# Patient Record
Sex: Male | Born: 1995 | Race: White | Hispanic: No | Marital: Single | Smoking: Never smoker
Health system: Southern US, Community
[De-identification: ages and names within clinical notes are randomized; demographics above are authoritative.]

## PROBLEM LIST (undated history)

## (undated) DIAGNOSIS — I10 Essential (primary) hypertension: Secondary | ICD-10-CM

## (undated) HISTORY — PX: TONSILLECTOMY: SUR1361

## (undated) HISTORY — PX: WISDOM TOOTH EXTRACTION: SHX21

---
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
## 2015-03-24 ENCOUNTER — Encounter: Payer: Self-pay | Admitting: *Deleted

## 2015-03-24 ENCOUNTER — Emergency Department
Admission: EM | Admit: 2015-03-24 | Discharge: 2015-03-24 | Disposition: A | Discharge status: Home / Self Care | Payer: Medicaid Other | Attending: Emergency Medicine | Admitting: Emergency Medicine

## 2015-03-24 DIAGNOSIS — R21 Rash and other nonspecific skin eruption: Secondary | ICD-10-CM | POA: Diagnosis present

## 2015-03-24 DIAGNOSIS — L03115 Cellulitis of right lower limb: Secondary | ICD-10-CM | POA: Diagnosis not present

## 2015-03-24 MED ORDER — SULFAMETHOXAZOLE-TRIMETHOPRIM 800-160 MG PO TABS: 1.0000 | ORAL_TABLET | Freq: Two times a day (BID) | ORAL | Status: AC

## 2015-03-24 MED ORDER — SULFAMETHOXAZOLE-TRIMETHOPRIM 800-160 MG PO TABS
2.0000 | ORAL_TABLET | Freq: Once | ORAL | Status: AC
  Administered 2015-03-24: 2 via ORAL
  Filled 2015-03-24: qty 2

## 2015-03-24 NOTE — ED Notes (Signed)
Pt. Going home with mother. 

## 2015-03-24 NOTE — ED Notes (Signed)
Pt. Walked into room with no distress.  Pt. Has a rounded red area on lower outside of rt. Leg about 3 inches wide.  Pt. States he noticed it yesterday.

## 2015-03-24 NOTE — ED Notes (Signed)
Pt has abscess to right lower leg.  No drainage.  No itching.

## 2015-03-24 NOTE — ED Provider Notes (Signed)
[facility] Emergency Department Provider Note  ____________________________________________  Time seen: 2:15 AM  I have reviewed the triage vital signs and the nursing notes.   HISTORY  Chief Complaint Abscess     HPI Geoffrey Rodriguez is a 19 y.o. male presents with possible abscess to right lower leg that he noted 2 days ago. Patient describes the area as redness and discomfort. Patient denies any insect bite including ticks that he is aware of. Patient denies any fever no joint pain. Patient denies any previous abscess.    Past medical history None There are no active problems to display for this patient.   Past surgical history None No current outpatient prescriptions on file.  Allergies No known drug allergies No family history on file.  Social History Social History  Substance Use Topics  . Smoking status: Never Smoker   . Smokeless tobacco: Not on file  . Alcohol Use: No    Review of Systems  Constitutional: Negative for fever. Eyes: Negative for visual changes. ENT: Negative for sore throat. Cardiovascular: Negative for chest pain. Respiratory: Negative for shortness of breath. Gastrointestinal: Negative for abdominal pain, vomiting and diarrhea. Genitourinary: Negative for dysuria. Musculoskeletal: Negative for back pain. Skin: Positive for rash. Neurological: Negative for headaches, focal weakness or numbness.   10-point ROS otherwise negative.  ____________________________________________   PHYSICAL EXAM:  VITAL SIGNS: ED Triage Vitals  Enc Vitals Group     BP 03/24/15 0047 155/83 mmHg     Pulse Rate 03/24/15 0047 99     Resp 03/24/15 0047 18     Temp 03/24/15 0047 98.4 F (36.9 C)     Temp Source 03/24/15 0047 Oral     SpO2 03/24/15 0047 100 %     Weight 03/24/15 0047 290 lb (131.543 kg)     Height 03/24/15 0047 6\' 4"  (1.93 m)     Head Cir --      Peak Flow --      Pain Score --      Pain Loc --    Pain Edu? --      Excl. in GC? --      Constitutional: Alert and oriented. Well appearing and in no distress. Musculoskeletal: Nontender with normal range of motion in all extremities. No joint effusions.  No lower extremity tenderness nor edema. Neurologic:  Normal speech and language.  Speech is normal.  Skin:  Positive blanching erythema area of approximately 5 x 5 cm on the right lower leg Psychiatric: Mood and affect are normal. Speech and behavior are normal. Patient exhibits appropriate insight and judgment.       INITIAL IMPRESSION / ASSESSMENT AND PLAN / ED COURSE  Pertinent labs & imaging results that were available during my care of the patient were reviewed by me and considered in my medical decision making (see chart for details).  Patient received Bactrim DS in the emergency department and we prescribed the same at home for cellulitis without any evidence of an abscess at this time. I counseled the patient to return to the emergency department immediately if the area of redness or discomfort were to occur. Consider the possibility of Lyme disease however area of erythema does not appear to be erythema migrans at this time  ____________________________________________   FINAL CLINICAL IMPRESSION(S) / ED DIAGNOSES  Final diagnoses:  Cellulitis of right leg      Darci Currentandolph N Brown, MD 03/24/15 [phone]

## 2015-03-24 NOTE — Discharge Instructions (Signed)

## 2016-04-20 ENCOUNTER — Emergency Department
Admission: EM | Admit: 2016-04-20 | Discharge: 2016-04-20 | Disposition: A | Discharge status: Home / Self Care | Payer: Medicaid Other | Attending: Emergency Medicine | Admitting: Emergency Medicine

## 2016-04-20 ENCOUNTER — Encounter: Payer: Self-pay | Admitting: *Deleted

## 2016-04-20 DIAGNOSIS — Z23 Encounter for immunization: Secondary | ICD-10-CM | POA: Insufficient documentation

## 2016-04-20 DIAGNOSIS — Y990 Civilian activity done for income or pay: Secondary | ICD-10-CM | POA: Insufficient documentation

## 2016-04-20 DIAGNOSIS — S0181XA Laceration without foreign body of other part of head, initial encounter: Secondary | ICD-10-CM | POA: Insufficient documentation

## 2016-04-20 DIAGNOSIS — S0990XA Unspecified injury of head, initial encounter: Secondary | ICD-10-CM

## 2016-04-20 DIAGNOSIS — W208XXA Other cause of strike by thrown, projected or falling object, initial encounter: Secondary | ICD-10-CM | POA: Diagnosis not present

## 2016-04-20 DIAGNOSIS — Y9389 Activity, other specified: Secondary | ICD-10-CM | POA: Insufficient documentation

## 2016-04-20 DIAGNOSIS — Y929 Unspecified place or not applicable: Secondary | ICD-10-CM | POA: Diagnosis not present

## 2016-04-20 MED ORDER — TETANUS-DIPHTH-ACELL PERTUSSIS 5-2.5-18.5 LF-MCG/0.5 IM SUSP
0.5000 mL | Freq: Once | INTRAMUSCULAR | Status: AC
  Administered 2016-04-20: 0.5 mL via INTRAMUSCULAR
  Filled 2016-04-20: qty 0.5

## 2016-04-20 NOTE — ED Provider Notes (Signed)
[facility] Emergency Department Provider Note  ____________________________________________  Time seen: Approximately 10:55 PM  I have reviewed the triage vital signs and the nursing notes.   HISTORY  Chief Complaint Head Injury and Abrasion    HPI Geoffrey Rodriguez is a 20 y.o. male who presents emergency department complaining of minor head injury. Patient states that he was at work when a piece of sheet metal fell and caught him in the forehead. He reports a laceration to the area that was bleeding. He was able to control bleeding easily with direct pressure. Patient denies any loss consciousness. He denies any headache, vision changes, neck pain, chest pain, shortness of breath, abdominal pain, nausea or vomiting. Patient is unsure of his last tetanus shot. No other complaints or injuries. No medications prior to arrival.   No past medical history on file.  There are no active problems to display for this patient.   No past surgical history on file.  Prior to Admission medications   Not on File    Allergies Patient has no known allergies.  No family history on file.  Social History Social History  Substance Use Topics  . Smoking status: Never Smoker  . Smokeless tobacco: Never Used  . Alcohol use No     Review of Systems  Constitutional: No fever/chills Eyes: No visual changes Cardiovascular: no chest pain. Respiratory: no cough. No SOB. Gastrointestinal: No abdominal pain.  No nausea, no vomiting. Musculoskeletal: Negative for musculoskeletal pain. Skin: Positive for laceration to the forehead. Neurological: Negative for headaches, focal weakness or numbness. 10-point ROS otherwise negative.  ____________________________________________   PHYSICAL EXAM:  VITAL SIGNS: ED Triage Vitals  Enc Vitals Group     BP 04/20/16 2226 (!) 141/67     Pulse Rate 04/20/16 2226 90     Resp 04/20/16 2226 20     Temp 04/20/16 2226 98 F (36.7  C)     Temp Source 04/20/16 2226 Oral     SpO2 04/20/16 2226 99 %     Weight 04/20/16 2229 270 lb (122.5 kg)     Height 04/20/16 2229 6\' 4"  (1.93 m)     Head Circumference --      Peak Flow --      Pain Score 04/20/16 2229 0     Pain Loc --      Pain Edu? --      Excl. in GC? --      Constitutional: Alert and oriented. Well appearing and in no acute distress. Eyes: Conjunctivae are normal. PERRL. EOMI. Head: Small, 0.5 cm laceration noted to the left side of the forehead. No bleeding. No foreign body. Edges are well approximated. No surrounding ecchymosis or edema. Patient is nontender to palpation over the osseous structures of the skull and face. No battle signs. No raccoon eyes. No serosanguineous fluid drainage from the ears or nares.. ENT:      Ears:       Nose: No congestion/rhinnorhea.      Mouth/Throat: Mucous membranes are moist.  Neck: No stridor.  No cervical spine tenderness to palpation.  Cardiovascular: Normal rate, regular rhythm. Normal S1 and S2.  Good peripheral circulation. Respiratory: Normal respiratory effort without tachypnea or retractions. Lungs CTAB. Good air entry to the bases with no decreased or absent breath sounds. Musculoskeletal: Full range of motion to all extremities. No gross deformities appreciated. Neurologic:  Normal speech and language. No gross focal neurologic deficits are appreciated.  Skin:  Skin  is warm, dry and intact. No rash noted. See above note for small laceration to the forehead. Psychiatric: Mood and affect are normal. Speech and behavior are normal. Patient exhibits appropriate insight and judgement.   ____________________________________________   LABS (all labs ordered are listed, but only abnormal results are displayed)  Labs Reviewed - No data to display ____________________________________________  EKG   ____________________________________________  RADIOLOGY   No results  found.  ____________________________________________    PROCEDURES  Procedure(s) performed:    Marland Kitchen.Marland Kitchen.Laceration Repair Date/Time: 04/20/2016 10:55 PM Performed by: Gala RomneyUTHRIELL, JONATHAN D Authorized by: Gala RomneyUTHRIELL, JONATHAN D   Consent:    Consent obtained:  Verbal   Consent given by:  Patient   Alternatives discussed:  No treatment Anesthesia (see MAR for exact dosages):    Anesthesia method:  None Laceration details:    Location:  Face   Face location:  Forehead   Length (cm):  0.5 Repair type:    Repair type:  Simple Exploration:    Wound exploration: entire depth of wound probed and visualized     Wound extent: no foreign bodies/material noted     Contaminated: no   Treatment:    Area cleansed with:  Shur-Clens   Amount of cleaning:  Standard   Irrigation solution:  Sterile saline   Irrigation method:  Syringe Skin repair:    Repair method:  Tissue adhesive Approximation:    Approximation:  Close Post-procedure details:    Dressing:  Open (no dressing)   Patient tolerance of procedure:  Tolerated well, no immediate complications      Medications  Tdap (BOOSTRIX) injection 0.5 mL (0.5 mLs Intramuscular Given 04/20/16 2300)     ____________________________________________   INITIAL IMPRESSION / ASSESSMENT AND PLAN / ED COURSE  Pertinent labs & imaging results that were available during my care of the patient were reviewed by me and considered in my medical decision making (see chart for details).  Review of the [place] CSRS was performed in accordance of the [facility] prior to dispensing any controlled drugs.  Clinical Course     Patient's diagnosis is consistent with Minor head injury resulting in left-sided forehead laceration. This closed as described above. Patient's exam is reassuring and there is no indication for imaging at this time. Patient's tetanus shot is updated at this time. He can take Tylenol and Motrin at home as needed for pain. Patient will  follow primary care as needed.. Patient is given ED precautions to return to the ED for any worsening or new symptoms.     ____________________________________________  FINAL CLINICAL IMPRESSION(S) / ED DIAGNOSES  Final diagnoses:  Minor head injury, initial encounter  Laceration of forehead, initial encounter      NEW MEDICATIONS STARTED DURING THIS VISIT:  New Prescriptions   No medications on file        This chart was dictated using voice recognition software/Dragon. Despite best efforts to proofread, errors can occur which can change the meaning. Any change was purely unintentional.    Racheal PatchesJonathan D Cuthriell, PA-C 04/20/16 [phone]    Loleta Roseory Forbach, MD 04/21/16 [phone]

## 2016-04-20 NOTE — ED Triage Notes (Signed)
Pt has abrasion to forehead.  Pt states a piece of metal fell off a shelf tonight at work.  Bleeding controlled   No loc.  No vomiting.  Pt states WC.   pt alert.

## 2016-04-20 NOTE — ED Notes (Signed)
Abrasion to forehead, bleeding controlled at this time, pt denies pain

## 2017-10-28 ENCOUNTER — Emergency Department
Admission: EM | Admit: 2017-10-28 | Discharge: 2017-10-28 | Disposition: A | Discharge status: Home / Self Care | Payer: Self-pay | Attending: Emergency Medicine | Admitting: Emergency Medicine

## 2017-10-28 ENCOUNTER — Encounter: Payer: Self-pay | Admitting: Emergency Medicine

## 2017-10-28 DIAGNOSIS — L089 Local infection of the skin and subcutaneous tissue, unspecified: Secondary | ICD-10-CM | POA: Insufficient documentation

## 2017-10-28 MED ORDER — SULFAMETHOXAZOLE-TRIMETHOPRIM 800-160 MG PO TABS: 1.0000 | ORAL_TABLET | Freq: Two times a day (BID) | ORAL | 0 refills | Status: DC

## 2017-10-28 NOTE — ED Triage Notes (Signed)
Pt comes into the ED  Via POV c/o right index finger injury.  Patient states he woke up with a bump and now has swelling on his finger.  Denies any known injury to the finger.  Patient still has dexterity.  Patient in NAD at this time.

## 2017-10-28 NOTE — Discharge Instructions (Addendum)
Begin soaking your hand in warm soapy water 2-3 times daily.  Begin taking Bactrim DS twice daily for 10 days.  You may also take ibuprofen for inflammation and pain.  Follow-up with your primary care provider if any continued problems.  Return to the emergency department if any severe worsening of your symptoms or fever above 101.

## 2017-10-28 NOTE — ED Provider Notes (Signed)
[facility] Emergency Department Provider Note  ___________________________________________   First MD Initiated Contact with Patient 10/28/17 1251     (approximate)  I have reviewed the triage vital signs and the nursing notes.   HISTORY  Chief Complaint Finger Injury   HPI Geoffrey Rodriguez is a 22 y.o. male is here with complaint of redness and swelling to his right index finger.  Patient states that he woke up with a bump to his finger.  He denies any injury and also does not work around metal.  He is unaware of any foreign body or insect bite.  Patient continues to move his finger without any difficulty or restriction.  Currently rates his pain is 1/10.  History reviewed. No pertinent past medical history.  There are no active problems to display for this patient.   History reviewed. No pertinent surgical history.  Prior to Admission medications   Medication Sig Start Date End Date Taking? Authorizing Provider  sulfamethoxazole-trimethoprim (BACTRIM DS,SEPTRA DS) 800-160 MG tablet Take 1 tablet by mouth 2 (two) times daily. 10/28/17   Tommi Rumps, PA-C    Allergies Patient has no known allergies.  No family history on file.  Social History Social History   Tobacco Use  . Smoking status: Never Smoker  . Smokeless tobacco: Never Used  Substance Use Topics  . Alcohol use: No  . Drug use: Never    Review of Systems Constitutional: No fever/chills Cardiovascular: Denies chest pain. Respiratory: Denies shortness of breath. Genitourinary: Negative for dysuria. Musculoskeletal: Right index finger redness. Skin: Erythema right index finger. Neurological: Negative for focal weakness or numbness. ____________________________________________   PHYSICAL EXAM:  VITAL SIGNS: ED Triage Vitals [10/28/17 1221]  Enc Vitals Group     BP (!) 170/86     Pulse Rate 100     Resp 17     Temp 97.9 F (36.6 C)     Temp Source Oral     SpO2  100 %     Weight 290 lb (131.5 kg)     Height  (1.854 m)     Head Circumference      Peak Flow      Pain Score 1     Pain Loc      Pain Edu?      Excl. in GC?     Constitutional: Alert and oriented. Well appearing and in no acute distress. Eyes: Conjunctivae are normal.  Head: Atraumatic. Neck: No stridor.   Cardiovascular: Normal rate, regular rhythm. Grossly normal heart sounds.  Good peripheral circulation. Respiratory: Normal respiratory effort.  No retractions. Lungs CTAB. Musculoskeletal: Examination of the right index finger there is some erythema lateral aspect without drainage.  There is no tenderness on palpation.  Areas erythematous and measures approximately 2 cm in diameter.  Range of motion is within normal limits both with flexion and extension.  Motor sensory function intact.  Capillary refill is less than 3 seconds. Neurologic:  Normal speech and language. No gross focal neurologic deficits are appreciated. No gait instability. Skin:  Skin is warm, dry and intact. No rash noted. Psychiatric: Mood and affect are normal. Speech and behavior are normal.  ____________________________________________   LABS (all labs ordered are listed, but only abnormal results are displayed)  Labs Reviewed - No data to display  PROCEDURES  Procedure(s) performed: None  Procedures  Critical Care performed: No  ____________________________________________   INITIAL IMPRESSION / ASSESSMENT AND PLAN / ED COURSE  As  part of my medical decision making, I reviewed the following data within the electronic MEDICAL RECORD NUMBER Notes from prior ED visits and [place] Controlled Substance Database  Patient has small area of erythema on his right index finger suspicious for an early cellulitis.  Patient is encouraged to use warm compresses or soak his hand to 3 times a day.  Patient was started on Bactrim DS twice daily for 10 days.  He may take Tylenol or ibuprofen as needed for pain.  He is  to follow-up with his PCP if any continued problems or return to the emergency room if any severe worsening of his symptoms.  ____________________________________________   FINAL CLINICAL IMPRESSION(S) / ED DIAGNOSES  Final diagnoses:  Infection of skin of finger     ED Discharge Orders        Ordered    sulfamethoxazole-trimethoprim (BACTRIM DS,SEPTRA DS) 800-160 MG tablet  2 times daily     10/28/17 1321       Note:  This document was prepared using Dragon voice recognition software and may include unintentional dictation errors.    Tommi Rumps, PA-C 10/28/17 1413    Sharyn Creamer, MD 10/28/17 1626

## 2017-10-28 NOTE — ED Notes (Signed)
See triage note  Presents with pain and swelling to right index finger  Denies any injury but states she had gotten something off finger  Area is red and more swollen today  No fever

## 2018-08-23 ENCOUNTER — Other Ambulatory Visit: Payer: Self-pay

## 2018-08-23 ENCOUNTER — Emergency Department
Admission: EM | Admit: 2018-08-23 | Discharge: 2018-08-23 | Disposition: A | Discharge status: Home / Self Care | Payer: Self-pay | Attending: Emergency Medicine | Admitting: Emergency Medicine

## 2018-08-23 ENCOUNTER — Emergency Department: Payer: Self-pay

## 2018-08-23 DIAGNOSIS — Y999 Unspecified external cause status: Secondary | ICD-10-CM | POA: Insufficient documentation

## 2018-08-23 DIAGNOSIS — Y929 Unspecified place or not applicable: Secondary | ICD-10-CM | POA: Insufficient documentation

## 2018-08-23 DIAGNOSIS — X500XXA Overexertion from strenuous movement or load, initial encounter: Secondary | ICD-10-CM | POA: Insufficient documentation

## 2018-08-23 DIAGNOSIS — S8391XA Sprain of unspecified site of right knee, initial encounter: Secondary | ICD-10-CM | POA: Insufficient documentation

## 2018-08-23 DIAGNOSIS — Y9367 Activity, basketball: Secondary | ICD-10-CM | POA: Insufficient documentation

## 2018-08-23 DIAGNOSIS — Z79899 Other long term (current) drug therapy: Secondary | ICD-10-CM | POA: Insufficient documentation

## 2018-08-23 DIAGNOSIS — I10 Essential (primary) hypertension: Secondary | ICD-10-CM | POA: Insufficient documentation

## 2018-08-23 HISTORY — DX: Essential (primary) hypertension: I10

## 2018-08-23 MED ORDER — MELOXICAM 15 MG PO TABS: 15.0000 mg | ORAL_TABLET | Freq: Every day | ORAL | 0 refills | Status: DC

## 2018-08-23 MED ORDER — MELOXICAM 7.5 MG PO TABS
15.0000 mg | ORAL_TABLET | Freq: Once | ORAL | Status: AC
  Administered 2018-08-23: 15 mg via ORAL
  Filled 2018-08-23: qty 2

## 2018-08-23 NOTE — ED Provider Notes (Signed)
[facility] Emergency Department Provider Note ____________________________________________  Time seen: Approximately 8:15 PM  I have reviewed the triage vital signs and the nursing notes.   HISTORY  Chief Complaint Knee Pain    HPI Geoffrey Rodriguez is a 23 y.o. male who presents to the emergency department for evaluation and treatment of right knee pain that started earlier today. He reports that he landed awkwardly while playing basketball and felt a pop on the sides of the knee. Since then, he feels like it won't hold his weight. No alleviating measures attempted prior to arrival.  No history of knee injury.  Past Medical History:  Diagnosis Date  . Hypertension     There are no active problems to display for this patient.   Past Surgical History:  Procedure Laterality Date  . TONSILLECTOMY    . WISDOM TOOTH EXTRACTION      Prior to Admission medications   Medication Sig Start Date End Date Taking? Authorizing Provider  meloxicam (MOBIC) 15 MG tablet Take 1 tablet (15 mg total) by mouth daily. 08/23/18   Sebastain Fishbaugh, Rulon Eisenmenger B, FNP  sulfamethoxazole-trimethoprim (BACTRIM DS,SEPTRA DS) 800-160 MG tablet Take 1 tablet by mouth 2 (two) times daily. 10/28/17   Tommi Rumps, PA-C    Allergies Patient has no known allergies.  No family history on file.  Social History Social History   Tobacco Use  . Smoking status: Never Smoker  . Smokeless tobacco: Never Used  Substance Use Topics  . Alcohol use: Yes  . Drug use: Never    Review of Systems Constitutional: Negative for fever. Cardiovascular: Negative for chest pain. Respiratory: Negative for shortness of breath. Musculoskeletal: Positive for right knee pain. Skin: Negative for open wound or lesion Neurological: Negative for decrease in sensation  ____________________________________________   PHYSICAL EXAM:  VITAL SIGNS: ED Triage Vitals  Enc Vitals Group     BP 08/23/18 1913 (!)  150/92     Pulse Rate 08/23/18 1913 99     Resp 08/23/18 1913 18     Temp 08/23/18 1913 99.3 F (37.4 C)     Temp Source 08/23/18 1913 Oral     SpO2 08/23/18 1913 100 %     Weight 08/23/18 1913 300 lb (136.1 kg)     Height 08/23/18 1913 6\' 4"  (1.93 m)     Head Circumference --      Peak Flow --      Pain Score 08/23/18 1917 1     Pain Loc --      Pain Edu? --      Excl. in GC? --     Constitutional: Alert and oriented. Well appearing and in no acute distress. Eyes: Conjunctivae are clear without discharge or drainage Head: Atraumatic Neck: Supple Respiratory: No cough. Respirations are even and unlabored. Musculoskeletal: Right knee exam is reassuring.  No laxity on testing.  No obvious deformity.  No effusion or edema about the knee. Neurologic: Motor and sensory function of the right lower extremity is intact Skin: No open wounds or lesions noted on the right knee Psychiatric: Affect and behavior are appropriate.  ____________________________________________   LABS (all labs ordered are listed, but only abnormal results are displayed)  Labs Reviewed - No data to display ____________________________________________  RADIOLOGY  Image of the right knee is negative for any acute bony abnormality.  Image was viewed by me. ____________________________________________   PROCEDURES  Procedures  ____________________________________________   INITIAL IMPRESSION / ASSESSMENT AND PLAN /  ED COURSE  Geoffrey Rodriguez Rodriguez is a 23 y.o. who presents to the emergency department for treatment and evaluation of right knee pain.  Exam and image is reassuring.  He will be placed in a knee immobilizer and given meloxicam.  Patient instructed to follow-up with orthopedics if not improving over the week.  He was also instructed to return to the emergency department for symptoms that change or worsen if unable schedule an appointment with orthopedics or primary care.  Medications   meloxicam (MOBIC) tablet 15 mg (15 mg Oral Given 08/23/18 2034)    Pertinent labs & imaging results that were available during my care of the patient were reviewed by me and considered in my medical decision making (see chart for details).  _________________________________________   FINAL CLINICAL IMPRESSION(S) / ED DIAGNOSES  Final diagnoses:  Sprain of right knee, unspecified ligament, initial encounter    ED Discharge Orders         Ordered    meloxicam (MOBIC) 15 MG tablet  Daily     08/23/18 2025           If controlled substance prescribed during this visit, 12 month history viewed on the [place] prior to issuing an initial prescription for Schedule Rodriguez or III opiod.   Chinita Pester, FNP 08/26/18 1572    Sharyn Creamer, MD 08/28/18 [phone]

## 2018-08-23 NOTE — ED Triage Notes (Signed)
Patient c/o right knee pain that began while playing basketball today. Patient reports he felt popping sensation while playing.

## 2018-08-23 NOTE — Discharge Instructions (Signed)
Please follow up with orthopedics for symptoms that are not improving over the next several days.  Return to the ER for symptoms that change or worsen if unable to schedule an appointment.

## 2019-04-30 IMAGING — DX RIGHT KNEE - COMPLETE 4+ VIEW
4 series · 4 of 4 positions shown · non-contrast
Comparison: None

CLINICAL DATA: Onset of RIGHT knee pain while playing basketball
today, popping sensation, difficulty walking and standing, initial
encounter

EXAM:
RIGHT KNEE - COMPLETE 4+ VIEW

[knee ap]
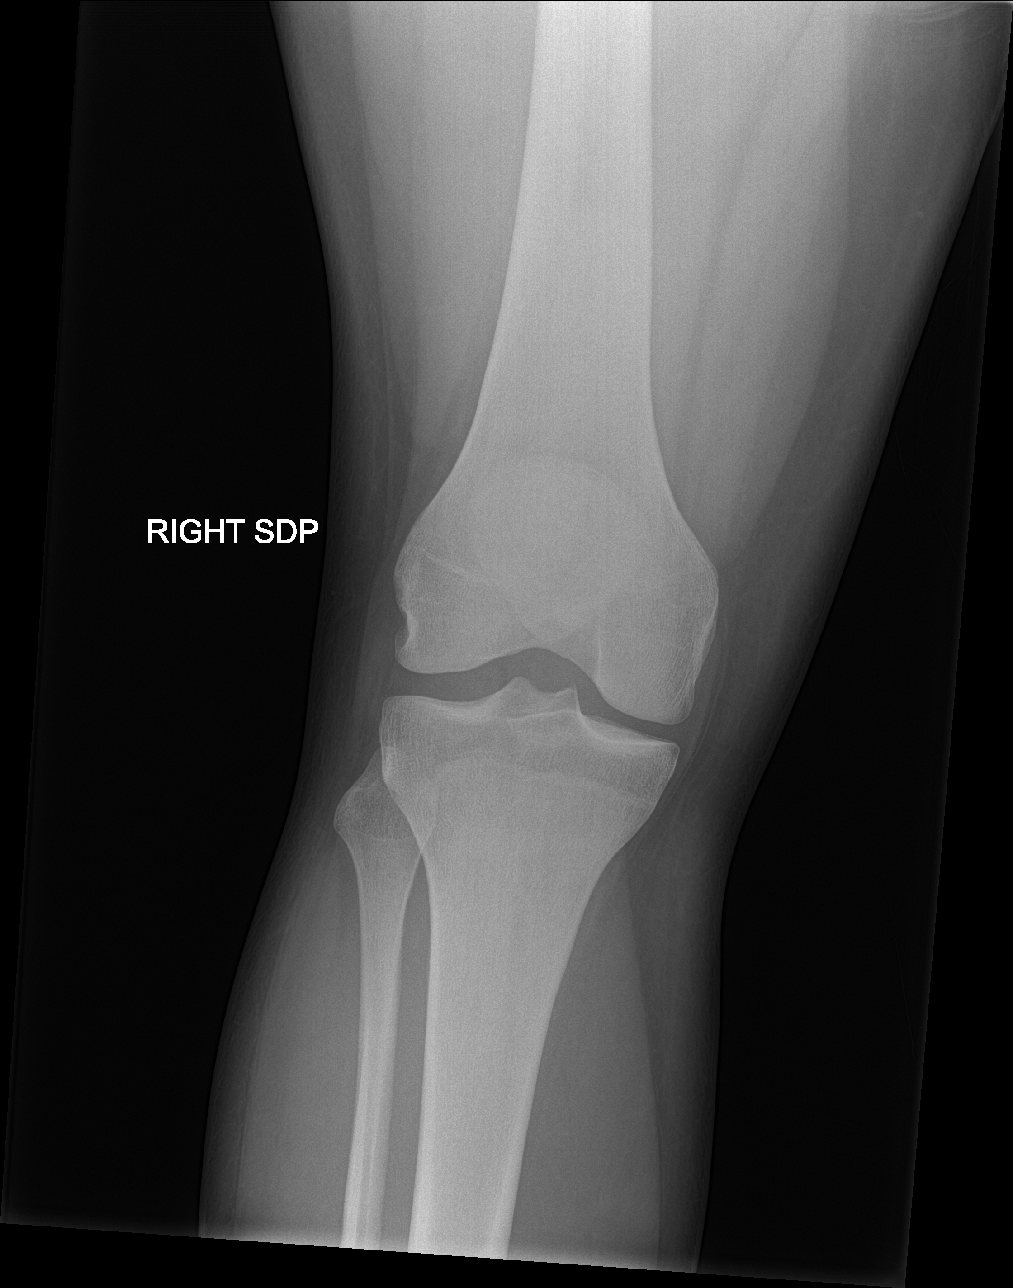

[knee lat]
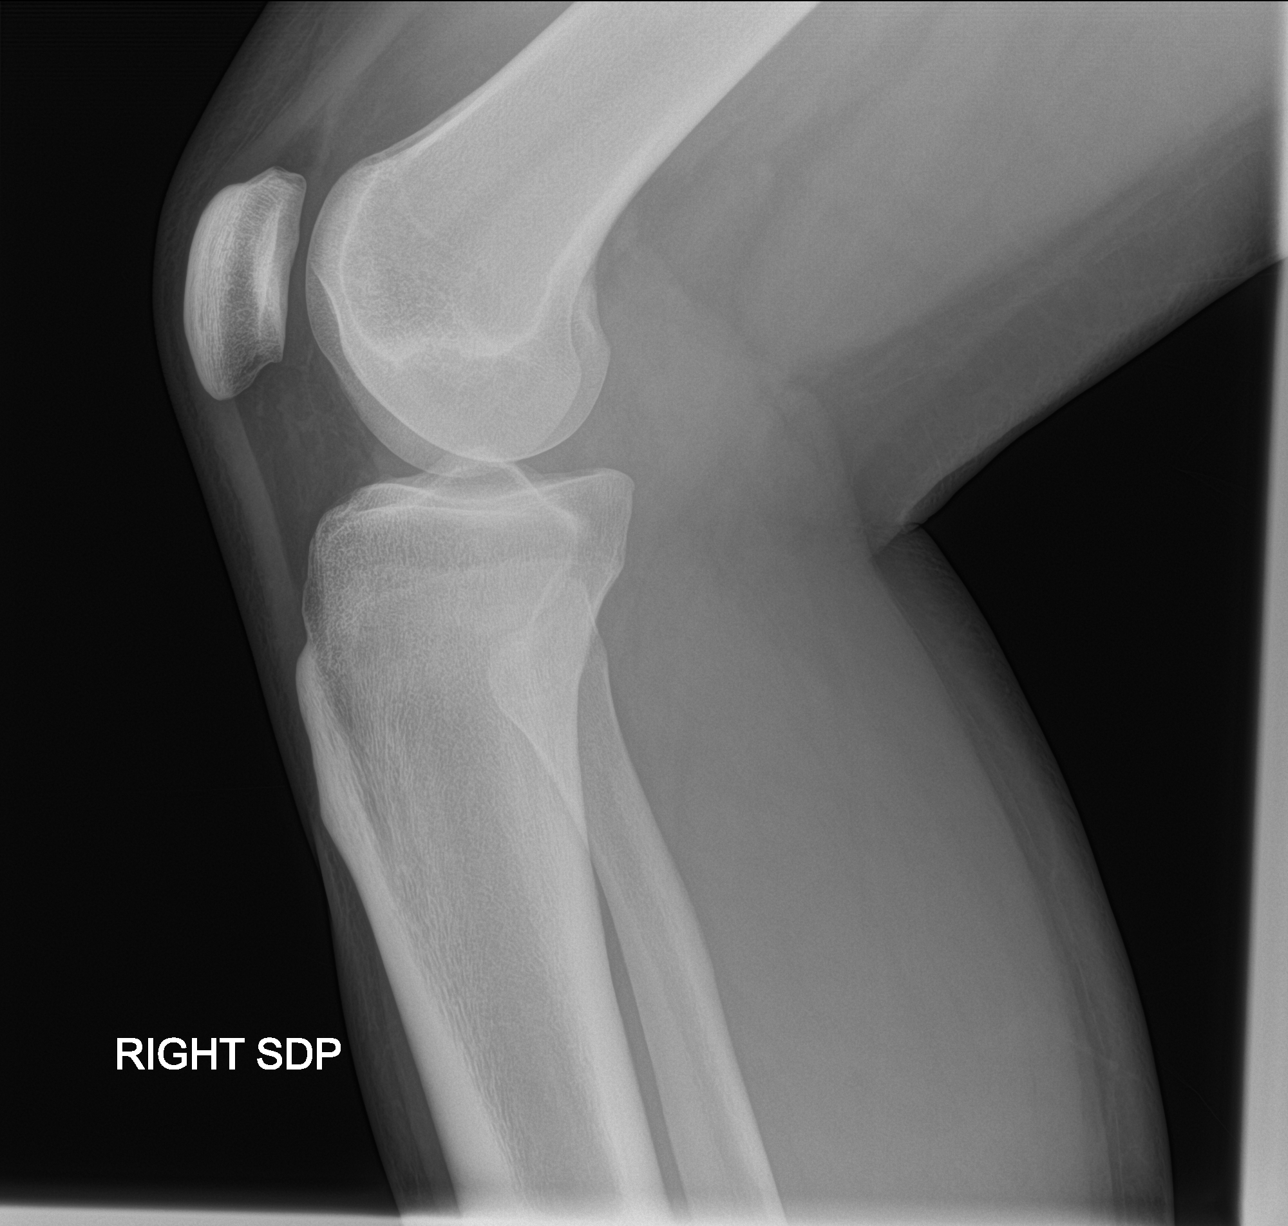

[knee obl (1 of 2)]
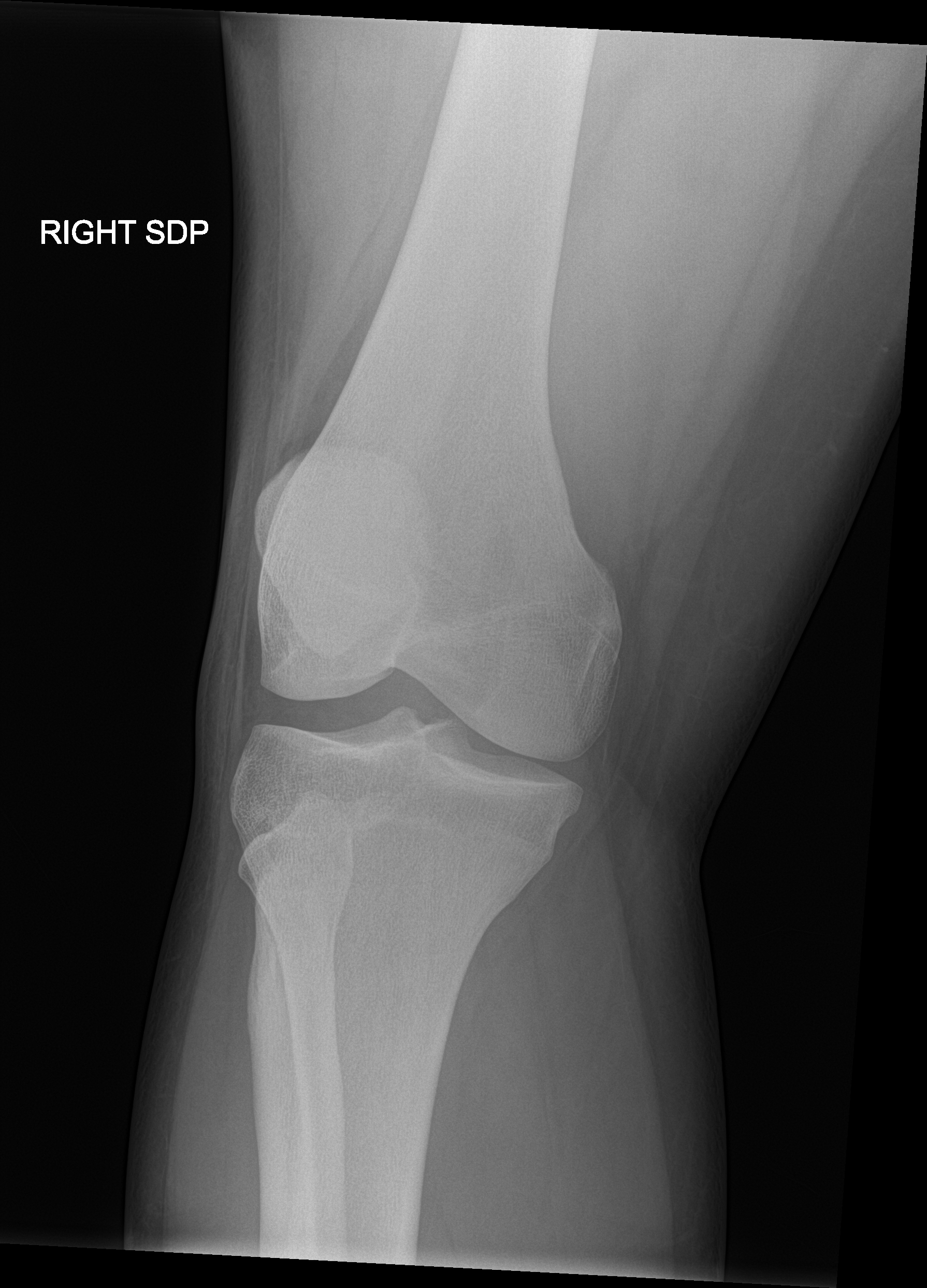

[knee obl (2 of 2)]
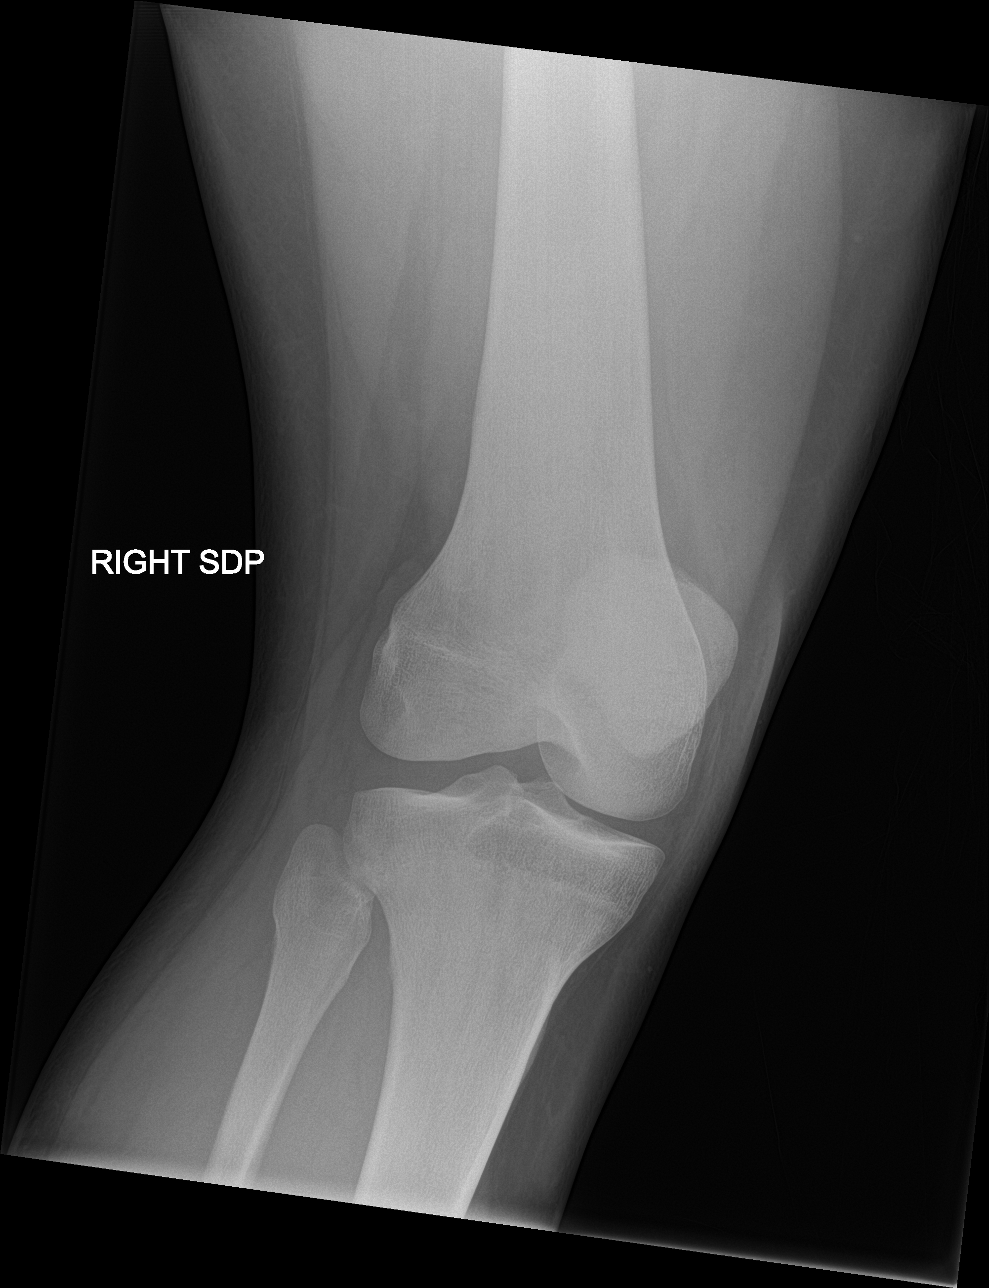

[4 of 4 positions shown; findings below may reference images not displayed]

FINDINGS: Osseous mineralization normal.

Joint spaces preserved.

No fracture, dislocation, or bone destruction.

No joint effusion.
IMPRESSION: Normal exam.

## 2020-09-10 ENCOUNTER — Encounter: Payer: Self-pay | Admitting: Emergency Medicine

## 2020-09-10 ENCOUNTER — Emergency Department
Admission: EM | Admit: 2020-09-10 | Discharge: 2020-09-10 | Disposition: A | Discharge status: Home / Self Care | Payer: BC Managed Care – PPO | Attending: Emergency Medicine | Admitting: Emergency Medicine

## 2020-09-10 ENCOUNTER — Other Ambulatory Visit: Payer: Self-pay

## 2020-09-10 DIAGNOSIS — Z91199 Patient's noncompliance with other medical treatment and regimen due to unspecified reason: Secondary | ICD-10-CM

## 2020-09-10 DIAGNOSIS — R21 Rash and other nonspecific skin eruption: Secondary | ICD-10-CM | POA: Diagnosis not present

## 2020-09-10 DIAGNOSIS — I10 Essential (primary) hypertension: Secondary | ICD-10-CM | POA: Insufficient documentation

## 2020-09-10 DIAGNOSIS — Z9119 Patient's noncompliance with other medical treatment and regimen: Secondary | ICD-10-CM | POA: Insufficient documentation

## 2020-09-10 LAB — URINALYSIS, COMPLETE (UACMP) WITH MICROSCOPIC
Bacteria, UA: NONE SEEN
Bilirubin Urine: NEGATIVE
Glucose, UA: NEGATIVE mg/dL
Ketones, ur: NEGATIVE mg/dL
Leukocytes,Ua: NEGATIVE
Nitrite: NEGATIVE
Protein, ur: NEGATIVE mg/dL
Specific Gravity, Urine: 1.017 (ref 1.005–1.030)
pH: 6 (ref 5.0–8.0)

## 2020-09-10 LAB — CBC WITH DIFFERENTIAL/PLATELET
Abs Immature Granulocytes: 0.05 10*3/uL (ref 0.00–0.07)
Basophils Absolute: 0.1 10*3/uL (ref 0.0–0.1)
Basophils Relative: 1 %
Eosinophils Absolute: 0.3 10*3/uL (ref 0.0–0.5)
Eosinophils Relative: 3 %
HCT: 46.1 % (ref 39.0–52.0)
Hemoglobin: 15.8 g/dL (ref 13.0–17.0)
Immature Granulocytes: 1 %
Lymphocytes Relative: 23 %
Lymphs Abs: 2.1 10*3/uL (ref 0.7–4.0)
MCH: 29.3 pg (ref 26.0–34.0)
MCHC: 34.3 g/dL (ref 30.0–36.0)
MCV: 85.4 fL (ref 80.0–100.0)
Monocytes Absolute: 0.6 10*3/uL (ref 0.1–1.0)
Monocytes Relative: 6 %
Neutro Abs: 6.2 10*3/uL (ref 1.7–7.7)
Neutrophils Relative %: 66 %
Platelets: 279 10*3/uL (ref 150–400)
RBC: 5.4 MIL/uL (ref 4.22–5.81)
RDW: 13.3 % (ref 11.5–15.5)
WBC: 9.4 10*3/uL (ref 4.0–10.5)
nRBC: 0 % (ref 0.0–0.2)

## 2020-09-10 LAB — COMPREHENSIVE METABOLIC PANEL
ALT: 54 U/L — ABNORMAL HIGH (ref 0–44)
AST: 34 U/L (ref 15–41)
Albumin: 4.9 g/dL (ref 3.5–5.0)
Alkaline Phosphatase: 68 U/L (ref 38–126)
Anion gap: 8 (ref 5–15)
BUN: 13 mg/dL (ref 6–20)
CO2: 26 mmol/L (ref 22–32)
Calcium: 9.5 mg/dL (ref 8.9–10.3)
Chloride: 103 mmol/L (ref 98–111)
Creatinine, Ser: 1.11 mg/dL (ref 0.61–1.24)
GFR, Estimated: >60 mL/min (ref >60)
Glucose, Bld: 126 mg/dL — ABNORMAL HIGH (ref 70–99)
Potassium: 3.6 mmol/L (ref 3.5–5.1)
Sodium: 137 mmol/L (ref 135–145)
Total Bilirubin: 0.7 mg/dL (ref 0.3–1.2)
Total Protein: 8.2 g/dL — ABNORMAL HIGH (ref 6.5–8.1)

## 2020-09-10 MED ORDER — LISINOPRIL 5 MG PO TABS: 5.0000 mg | ORAL_TABLET | Freq: Every day | ORAL | 1 refills | Status: AC

## 2020-09-10 MED ORDER — PREDNISONE 10 MG PO TABS: ORAL_TABLET | ORAL | 0 refills | Status: AC

## 2020-09-10 MED ORDER — DEXAMETHASONE SODIUM PHOSPHATE 10 MG/ML IJ SOLN
10.0000 mg | Freq: Once | INTRAMUSCULAR | Status: AC
  Administered 2020-09-10: 10 mg via INTRAVENOUS
  Filled 2020-09-10: qty 1

## 2020-09-10 NOTE — Discharge Instructions (Signed)
Follow-up with 1 the clinics listed on your discharge papers.  I have listed all the clinics in the area that charge on a sliding scale and also information about the [facility] is listed on your discharge papers which should be free.  2 prescriptions were [facility] to [facility] which appears to be the cheapest place is to get both medications with a good Rx card.  1 is the prednisone that we talked about for your rash.  You may continue taking Benadryl every 6 hours if needed for itching.  The other medication is for blood pressure and is once a day.  30 pills with 1 refill was written until you can be seen by a primary care provider.  You may also need be seen at an urgent care until you are able to get a appointment with a primary care provider but she will definitely need to be seen and treated for your hypertension as you are only 25 years old.

## 2020-09-10 NOTE — ED Triage Notes (Signed)
Pt reports a rash that starts under both arms and goes down for the past week. Pt sates rash is really itchy. Pt denies new foods, soaps, detergents, etc.

## 2020-09-10 NOTE — ED Provider Notes (Signed)
[facility] Emergency Department Provider Note  ____________________________________________   Event Date/Time   First MD Initiated Contact with Patient 09/10/20 1150     (approximate)  I have reviewed the triage vital signs and the nursing notes.   HISTORY  Chief Complaint Rash   HPI Geoffrey Rodriguez is a 25 y.o. male presents to the ED with rash bilateral axilla and torso for the past week.  Patient denies any difficulty breathing, swallowing or talking.  Patient is not aware of any new foods, soaps, deodorants or detergents and denies any previous allergens.  Patient currently states the rash itches all the time and was not better with Benadryl.  Patient's initial blood pressure in triage was 179/111.  He reports that he has had hypertension since the age of 14 and was placed on medication.  At that time he was seeing [facility] and was on medication that he no longer remembers but states he never went back for a follow-up and has not taken any antihypertensives in the last 9 years.  He denies any headache, visual changes, shortness of breath or chest pain.  He states everyone in his family has hypertension.  Currently he denies any pain.     Past Medical History:  Diagnosis Date  . Hypertension     There are no problems to display for this patient.   Past Surgical History:  Procedure Laterality Date  . TONSILLECTOMY    . WISDOM TOOTH EXTRACTION      Prior to Admission medications   Medication Sig Start Date End Date Taking? Authorizing Provider  lisinopril (ZESTRIL) 5 MG tablet Take 1 tablet (5 mg total) by mouth daily. 09/10/20 09/10/21 Yes Raeli Wiens L, PA-C  predniSONE (DELTASONE) 10 MG tablet Take 6 tablets  today, on day 2 take 5 tablets, day 3 take 4 tablets, day 4 take 3 tablets, day 5 take  2 tablets and 1 tablet the last day 09/10/20  Yes Tommi Rumps, PA-C    Allergies Patient has no known allergies.  No family history  on file.  Social History Social History   Tobacco Use  . Smoking status: Never Smoker  . Smokeless tobacco: Never Used  Substance Use Topics  . Alcohol use: Yes  . Drug use: Never    Review of Systems Constitutional: No fever/chills Eyes: No visual changes. ENT: No sore throat. Cardiovascular: Denies chest pain. Respiratory: Denies shortness of breath. Gastrointestinal: No abdominal pain.  No nausea, no vomiting.  No diarrhea.  Musculoskeletal: Negative for back pain. Skin: Positive for rash. Neurological: Negative for headaches, focal weakness or numbness.  ____________________________________________   PHYSICAL EXAM:  VITAL SIGNS: ED Triage Vitals [09/10/20 1149]  Enc Vitals Group     BP      Pulse      Resp      Temp      Temp src      SpO2      Weight 297 lb 9.9 oz (135 kg)     Height 6\' 4"  (1.93 m)     Head Circumference      Peak Flow      Pain Score 0     Pain Loc      Pain Edu?      Excl. in [place]?     Constitutional: Alert and oriented. Well appearing and in no acute distress. Eyes: Conjunctivae are normal. PERRL. EOMI. Head: Atraumatic. Nose: No congestion/rhinnorhea. Mouth/Throat: Mucous membranes are moist.  Oropharynx non-erythematous.  No edema present and uvula is midline. Neck: No stridor.   Cardiovascular: Normal rate, regular rhythm. Grossly normal heart sounds.  Good peripheral circulation. Respiratory: Normal respiratory effort.  No retractions. Lungs CTAB. Gastrointestinal: Soft and nontender. No distention. Musculoskeletal: Moves upper and lower extremities without difficulty and normal gait was noted. Neurologic:  Normal speech and language. No gross focal neurologic deficits are appreciated. No gait instability. Skin:  Skin is warm, dry and intact.  And has an erythematous rash that begins in the bilateral axilla and goes down the lateral chest.  No vesicles or pustules are noted.  Area is nontender to palpation.  No specific pattern is  noted. Psychiatric: Mood and affect are normal. Speech and behavior are normal.  ____________________________________________   LABS (all labs ordered are listed, but only abnormal results are displayed)  Labs Reviewed  COMPREHENSIVE METABOLIC PANEL - Abnormal; Notable for the following components:      Result Value   Glucose, Bld 126 (*)    Total Protein 8.2 (*)    ALT 54 (*)    All other components within normal limits  URINALYSIS, COMPLETE (UACMP) WITH MICROSCOPIC - Abnormal; Notable for the following components:   Color, Urine YELLOW (*)    APPearance CLEAR (*)    Hgb urine dipstick SMALL (*)    All other components within normal limits  CBC WITH DIFFERENTIAL/PLATELET     PROCEDURES  Procedure(s) performed (including Critical Care):  Procedures   ____________________________________________   INITIAL IMPRESSION / ASSESSMENT AND PLAN / ED COURSE  As part of my medical decision making, I reviewed the following data within the electronic MEDICAL RECORD NUMBER Notes from prior ED visits and [place] Controlled Substance Database  25 year old male presents to the ED with complaint of bilateral axillary rash for approximately 1 week.  Patient states that itching has been very bad but has not been relieved with Benadryl.  He is unaware of any actual allergens and states he is not used anything new.  He denies any difficulty breathing, swallowing or talking.  Patient also has a history of hypertension and was diagnosed at age [age].  He was seen at [facility] at that time and placed on antihypertensive which he is not followed up with and has not been on any medications for the last 9 years.  He has been aware that his blood pressure has been elevated.  He denies any headache, vision changes, chest pain, shortness of breath or dizziness.  We discussed the pros and cons of not being on blood pressure medication and have uncontrolled blood pressure.  Patient agrees to take this more serious.   Blood pressure prior to discharge was 165/94.  Patient was given Decadron while in the ED and itching was improved.  Patient was discharged with a prescription for prednisone taper dose and lisinopril 5 mg 1 daily #30 with 1 refill.  Patient was given a list of clinics that may be taken new patients and is on a sliding scale.  He also will check to see who his family sees but will make an appointment with someone to have a PCP and keep his blood pressure under control.  ____________________________________________   FINAL CLINICAL IMPRESSION(S) / ED DIAGNOSES  Final diagnoses:  Rash and nonspecific skin eruption  Hypertension, unspecified type  Medically noncompliant     ED Discharge Orders         Ordered    predniSONE (DELTASONE) 10 MG tablet  09/10/20 1329    lisinopril (ZESTRIL) 5 MG tablet  Daily        09/10/20 1329          *Please note:  Geoffrey Rodriguez was evaluated in Emergency Department on 09/10/2020 for the symptoms described in the history of present illness. He was evaluated in the context of the global COVID-19 pandemic, which necessitated consideration that the patient might be at risk for infection with the SARS-CoV-2 virus that causes COVID-19. Institutional protocols and algorithms that pertain to the evaluation of patients at risk for COVID-19 are in a state of rapid change based on information released by regulatory bodies including the CDC and federal and state organizations. These policies and algorithms were followed during the patient's care in the ED.  Some ED evaluations and interventions may be delayed as a result of limited staffing during and the pandemic.*   Note:  This document was prepared using Dragon voice recognition software and may include unintentional dictation errors.    Tommi Rumps, PA-C 09/10/20 1517    Delton [place], MD 09/10/20 [phone]

## 2020-09-10 NOTE — ED Notes (Signed)
Rash bilateral underarm and abdomen. Pt states took benadryl for it but not any better.

## 2020-09-10 NOTE — ED Notes (Addendum)
Pt states has history of HTN, but stopped taking BP meds on his own. Thayer Ohm, RN made aware.

## 2021-07-11 ENCOUNTER — Emergency Department: Payer: Self-pay

## 2021-07-11 ENCOUNTER — Other Ambulatory Visit: Payer: Self-pay

## 2021-07-11 ENCOUNTER — Emergency Department
Admission: EM | Admit: 2021-07-11 | Discharge: 2021-07-11 | Disposition: A | Discharge status: Home / Self Care | Payer: Self-pay | Attending: Emergency Medicine | Admitting: Emergency Medicine

## 2021-07-11 DIAGNOSIS — I10 Essential (primary) hypertension: Secondary | ICD-10-CM | POA: Insufficient documentation

## 2021-07-11 DIAGNOSIS — M79672 Pain in left foot: Secondary | ICD-10-CM | POA: Insufficient documentation

## 2021-07-11 NOTE — ED Triage Notes (Signed)
Pt comes into the ED via POV c/o left side foot pain that he woke up with this morning.  Pt denies any known injury to the foot.  Pt ambulatory to triage at this time.

## 2021-07-11 NOTE — Progress Notes (Signed)
Discharge instructions given to patient. Verbalized understanding. No acute distress at this time. Patient to transport hisself home.

## 2021-07-11 NOTE — ED Provider Notes (Signed)
[facility] Provider Note    Event Date/Time   First MD Initiated Contact with Patient 07/11/21 1628     (approximate)   History   Chief Complaint Foot Pain   HPI RIC CRAFTS II is a 26 y.o. male, history of hypertension, presents to the emergency department for evaluation of left-sided foot pain.  Patient states that he woke up this morning with pain in his left foot, predominantly on the plantar aspect between the third and fifth digits.  Patient states that ambulation is painful for him.  Denies recent injuries/illnesses, fever/chills, ankle pain, lower leg/upper leg pain, chest pain, shortness of breath, abdominal pain, or urinary symptoms.  History Limitations: No limitations      Physical Exam  Triage Vital Signs: ED Triage Vitals  Enc Vitals Group     BP 07/11/21 1617 (!) 162/110     Pulse Rate 07/11/21 1617 (!) 118     Resp 07/11/21 1617 20     Temp 07/11/21 1616 98.4 F (36.9 C)     Temp src --      SpO2 07/11/21 1617 98 %     Weight 07/11/21 1616 (!) 315 lb (142.9 kg)     Height 07/11/21 1616 6\' 4"  (1.93 m)     Head Circumference --      Peak Flow --      Pain Score 07/11/21 1616 1     Pain Loc --      Pain Edu? --      Excl. in [place]? --     Most recent vital signs: Vitals:   07/11/21 1617 07/11/21 1800  BP: (!) 162/110 (!) 131/98  Pulse: (!) 118 96  Resp: 20   Temp:    SpO2: 98% 97%    General: Awake, NAD.  CV: Good peripheral perfusion.  Resp: Normal effort.  Abd: Soft, non-tender. No distention.  Neuro: At baseline. No gross neurological deficits. Other: Tenderness when palpating the plantar aspect of the patient's left foot, predominantly between the third and fifth digits.  No gross deformities. No notable swelling or erythema.   Physical Exam    ED Results / Procedures / Treatments  Labs (all labs ordered are listed, but only abnormal results are displayed) Labs Reviewed - No data to display   EKG Not  applicable   RADIOLOGY  ED Provider Interpretation: I personally reviewed and interpreted this image.  No acute fractures.  DG Foot Complete Left  Result Date: 07/11/2021 CLINICAL DATA:  Foot pain for 3 days EXAM: LEFT FOOT - COMPLETE 3+ VIEW COMPARISON:  None. FINDINGS: Frontal, oblique, and lateral views of the left foot are obtained. No fracture, subluxation, or dislocation. Joint spaces are well preserved. Soft tissues are normal. IMPRESSION: 1. Unremarkable left foot. Electronically Signed   By: Randa Ngo M.D.   On: 07/11/2021 17:51    PROCEDURES:  Critical Care performed: None  Procedures    MEDICATIONS ORDERED IN ED: Medications - No data to display   IMPRESSION / MDM / [place] / ED COURSE  I reviewed the triage vital signs and the nursing notes.                              DEFORREST HICKINGBOTTOM II is a 26 y.o. male, history of hypertension, presents to the emergency department for evaluation of left-sided foot pain.  Patient states that he woke up this morning  with pain in his left foot, predominantly on the plantar aspect between the third and fifth digits.  Differential diagnosis includes, but is not limited to, metatarsal fracture, Morton's neuroma, plantar fasciitis  ED Course Patient appears well.  He is notably tachycardic at 118 and hypertensive at 162/110.  Patient states that he has a history of hypertension, and gets very anxious when in the hospital.  X-ray shows no evidence of acute fractures.  Assessment/Plan Given the patient's history, physical exam, work-up thus far, I do not suspect any serious or life-threatening pathology.  His signs and symptoms are consistent with plantar fasciitis versus Morton's neuroma.  Very low suspicion for underlying infection given his presentation and physical exam.  Advised the patient to take Tylenol/ibuprofen as needed for the pain and consider gelatin pad in his shoe.  We will provide the patient a referral to  orthopedics in case the pain does not resolve after a few weeks.  We will plan to discharge this patient.  Patient was provided with anticipatory guidance, return precautions, and educational material. Encouraged the patient to return to the emergency department at any time if they begin to experience any new or worsening symptoms.       FINAL CLINICAL IMPRESSION(S) / ED DIAGNOSES   Final diagnoses:  Foot pain, left     Rx / DC Orders   ED Discharge Orders     None        Note:  This document was prepared using Dragon voice recognition software and may include unintentional dictation errors.   Teodoro Spray, [place] 07/11/21 Lurena Nida    Carrie Mew, MD 07/11/21 [phone]

## 2021-07-11 NOTE — Discharge Instructions (Addendum)
-  Take Tylenol/ibuprofen as needed for pain -Follow-up with the orthopedist listed above if your symptoms fail to improve -Return to the emergency department at any time if you begin to experience any new or worsening symptoms.

## 2022-03-18 IMAGING — DX DG FOOT COMPLETE 3+V*L*
3 series · 3 of 3 positions shown · non-contrast
Comparison: None.

CLINICAL DATA: Foot pain for 3 days

EXAM:
LEFT FOOT - COMPLETE 3+ VIEW

[foot ap]
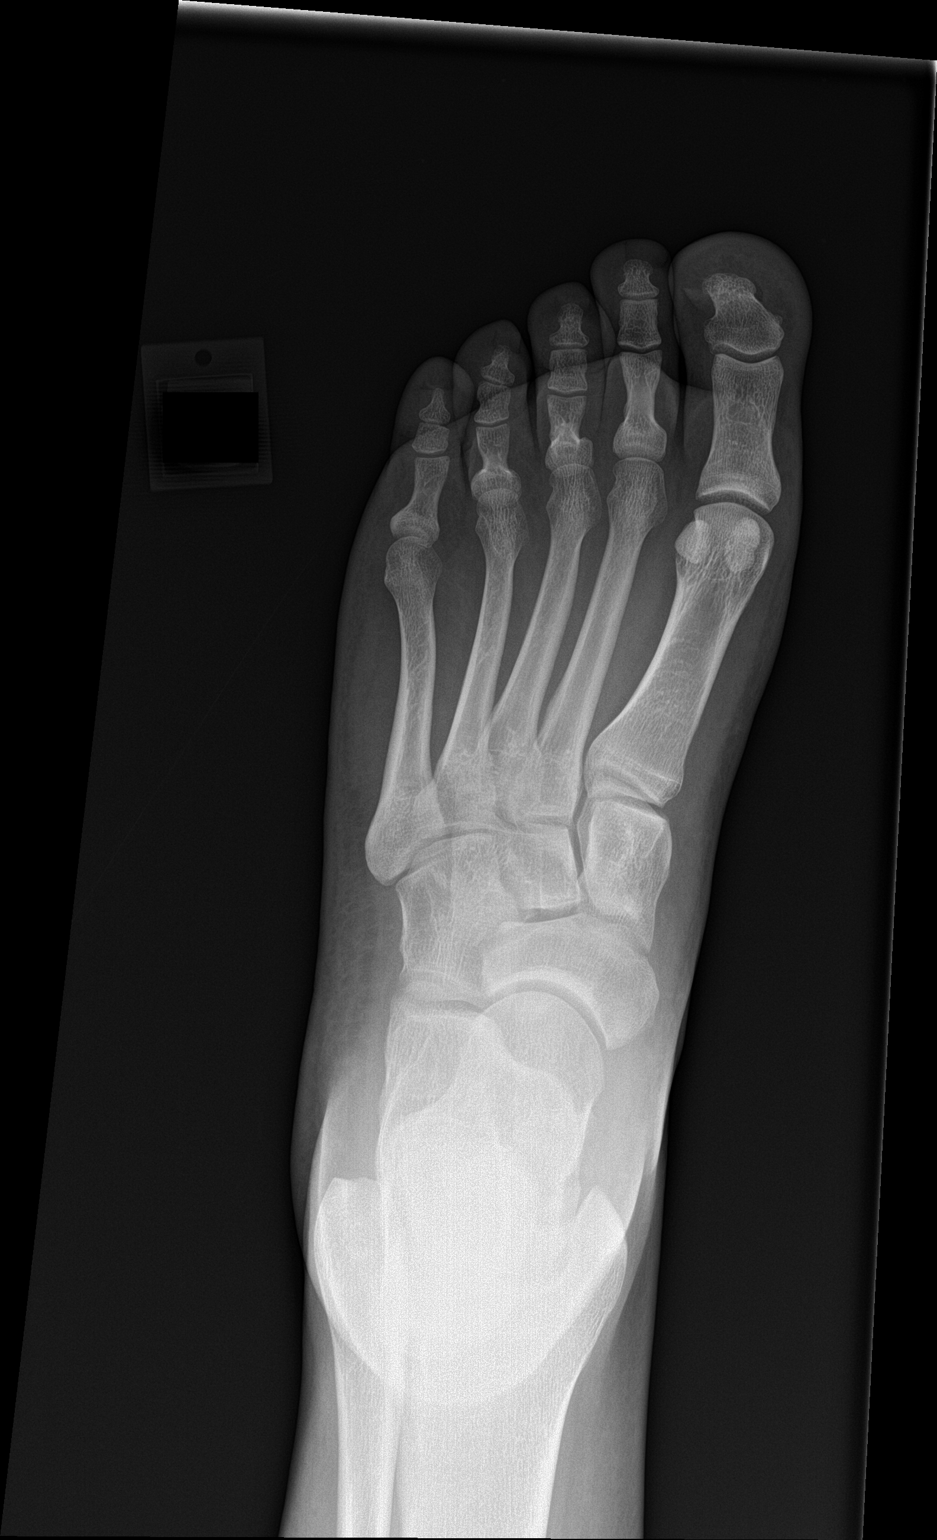

[foot obl]
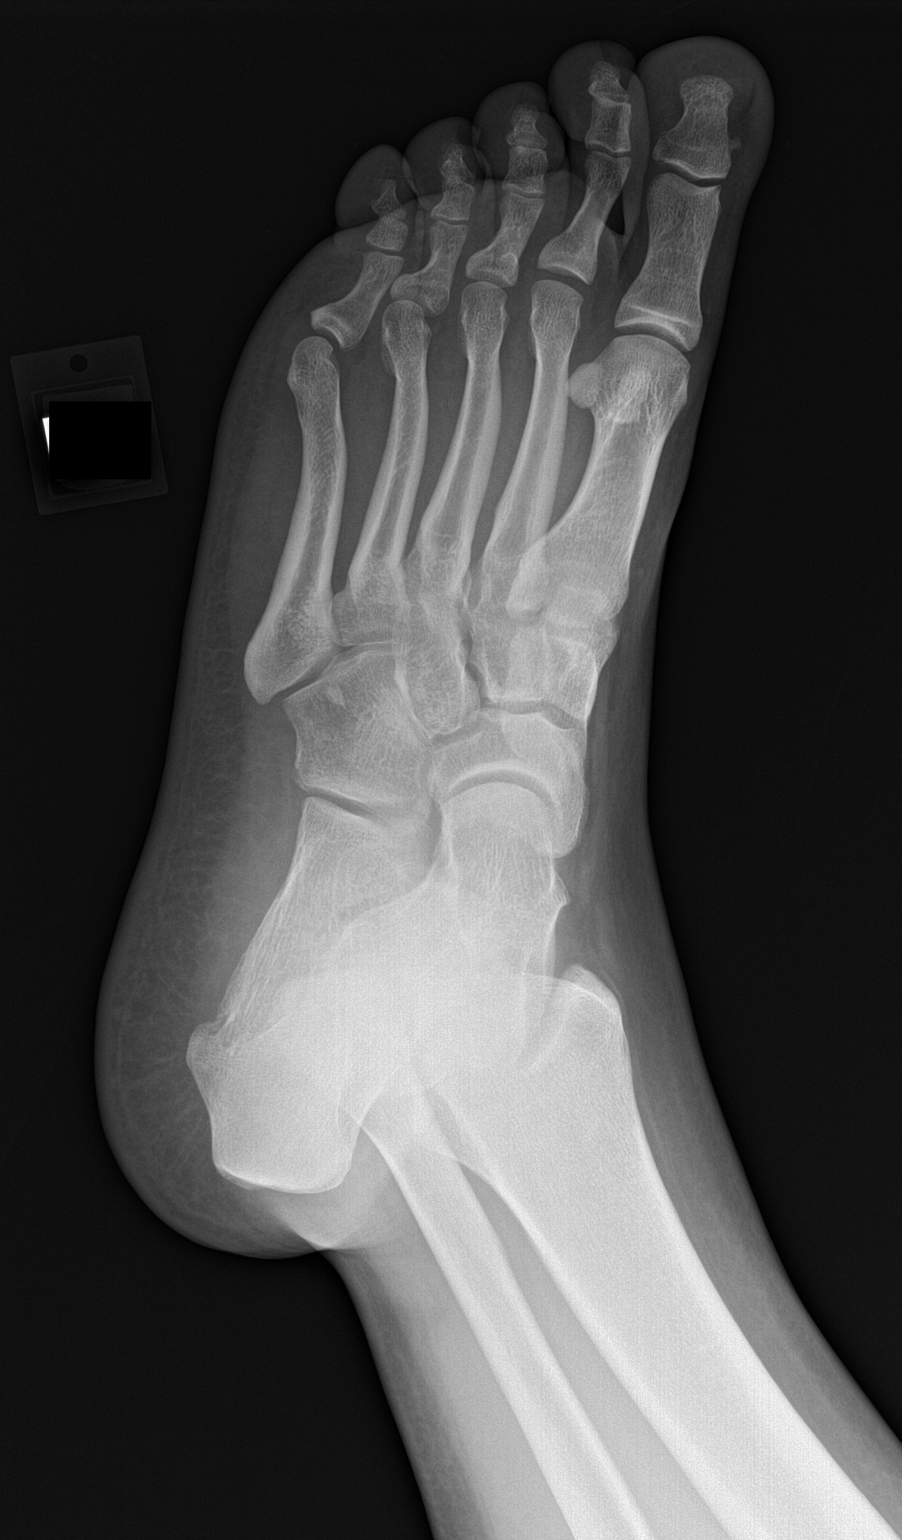

[foot lat]
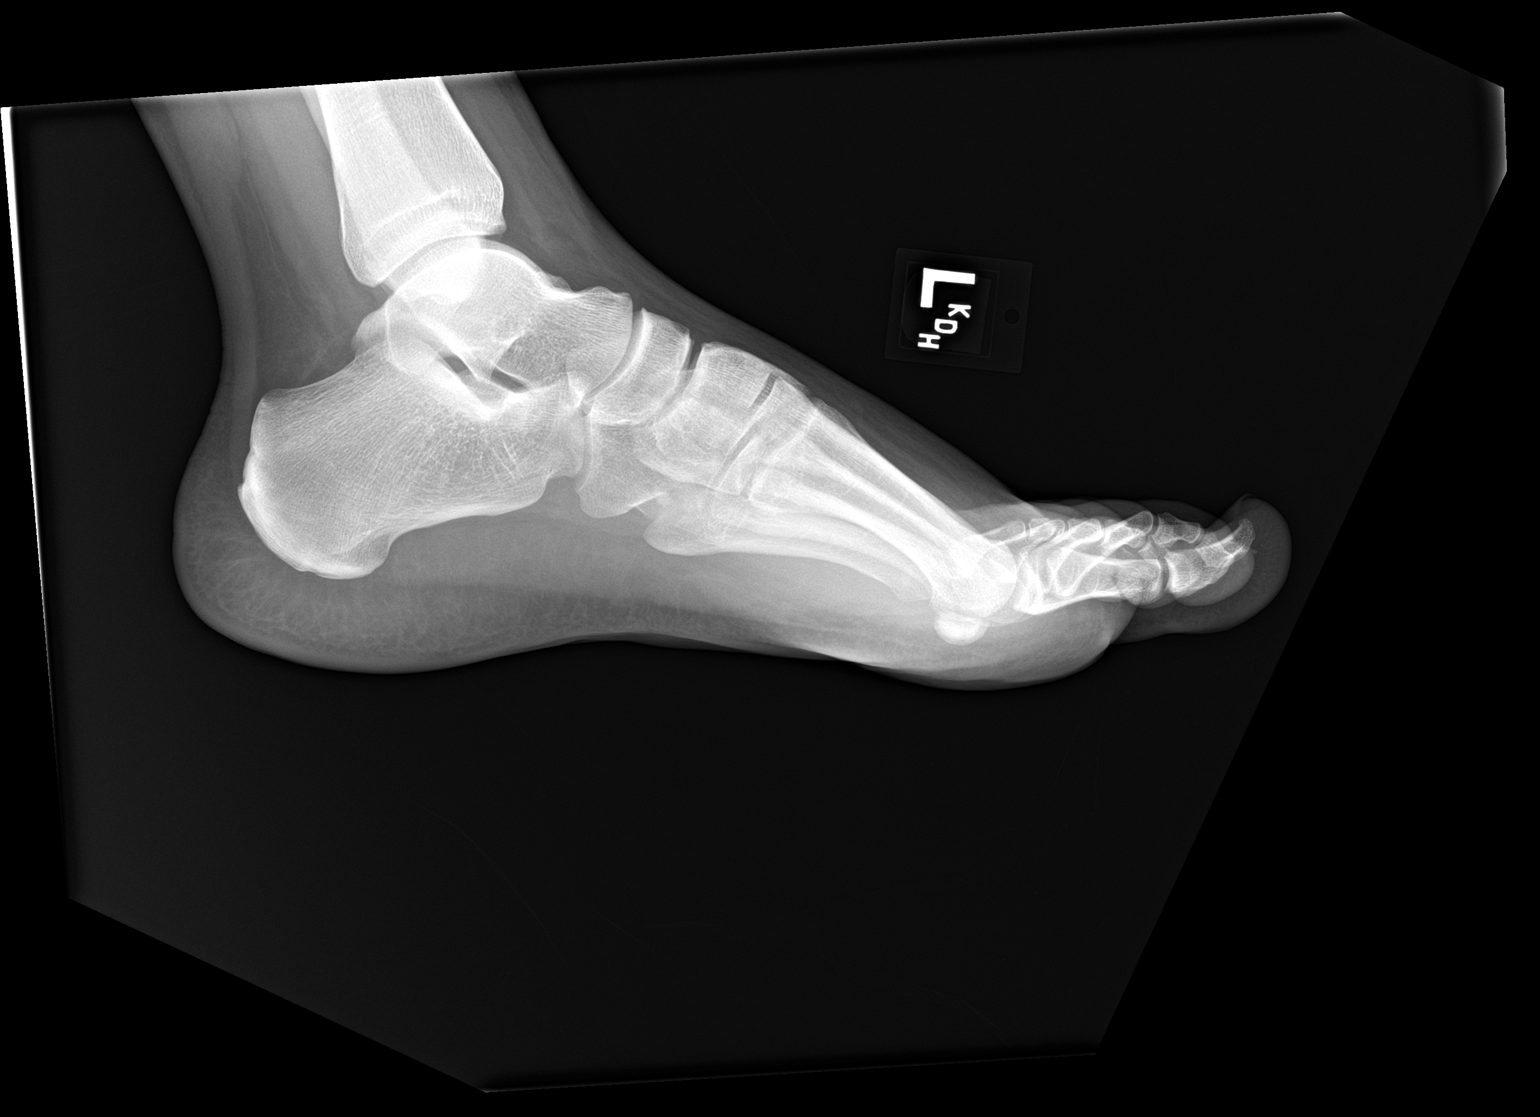

[3 of 3 positions shown; findings below may reference images not displayed]

FINDINGS: Frontal, oblique, and lateral views of the left foot are obtained.
No fracture, subluxation, or dislocation. Joint spaces are well
preserved. Soft tissues are normal.
IMPRESSION: 1. Unremarkable left foot.
